# Patient Record
Sex: Male | Born: 1980 | Race: White | Hispanic: No | Marital: Married | Smoking: Never smoker
Health system: Southern US, Community
[De-identification: ages and names within clinical notes are randomized; demographics above are authoritative.]

---

## 2002-03-14 ENCOUNTER — Inpatient Hospital Stay (HOSPITAL_COMMUNITY): Admission: AC | Admit: 2002-03-14 | Discharge: 2002-03-16 | Payer: Self-pay

## 2002-03-18 ENCOUNTER — Emergency Department (HOSPITAL_COMMUNITY): Admission: EM | Admit: 2002-03-18 | Discharge: 2002-03-18 | Payer: Self-pay | Admitting: Emergency Medicine

## 2002-03-18 ENCOUNTER — Encounter: Payer: Self-pay | Admitting: Emergency Medicine

## 2002-03-19 ENCOUNTER — Encounter: Payer: Self-pay | Admitting: Emergency Medicine

## 2003-04-10 ENCOUNTER — Encounter: Payer: Self-pay | Admitting: Orthopedic Surgery

## 2003-04-10 ENCOUNTER — Ambulatory Visit (HOSPITAL_COMMUNITY): Admission: RE | Admit: 2003-04-10 | Discharge: 2003-04-10 | Payer: Self-pay | Admitting: Orthopedic Surgery

## 2003-07-24 ENCOUNTER — Emergency Department (HOSPITAL_COMMUNITY): Admission: AD | Admit: 2003-07-24 | Discharge: 2003-07-24 | Payer: Self-pay | Admitting: Family Medicine

## 2006-04-23 ENCOUNTER — Emergency Department (HOSPITAL_COMMUNITY): Admission: EM | Admit: 2006-04-23 | Discharge: 2006-04-23 | Payer: Self-pay | Admitting: Emergency Medicine

## 2009-12-12 ENCOUNTER — Encounter: Admission: RE | Admit: 2009-12-12 | Discharge: 2009-12-12 | Payer: Self-pay | Admitting: Occupational Medicine
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
## 2014-04-09 ENCOUNTER — Other Ambulatory Visit (HOSPITAL_BASED_OUTPATIENT_CLINIC_OR_DEPARTMENT_OTHER): Payer: Self-pay | Admitting: Family Medicine

## 2014-04-09 DIAGNOSIS — R319 Hematuria, unspecified: Secondary | ICD-10-CM

## 2014-04-12 ENCOUNTER — Ambulatory Visit (HOSPITAL_BASED_OUTPATIENT_CLINIC_OR_DEPARTMENT_OTHER): Payer: BC Managed Care – PPO

## 2015-03-18 ENCOUNTER — Emergency Department (INDEPENDENT_AMBULATORY_CARE_PROVIDER_SITE_OTHER): Payer: Self-pay

## 2015-03-18 ENCOUNTER — Emergency Department (INDEPENDENT_AMBULATORY_CARE_PROVIDER_SITE_OTHER)
Admission: EM | Admit: 2015-03-18 | Discharge: 2015-03-18 | Disposition: A | Discharge status: Home / Self Care | Payer: Self-pay | Source: Home / Self Care | Attending: Emergency Medicine | Admitting: Emergency Medicine

## 2015-03-18 ENCOUNTER — Encounter: Payer: Self-pay | Admitting: Emergency Medicine

## 2015-03-18 DIAGNOSIS — S93402A Sprain of unspecified ligament of left ankle, initial encounter: Secondary | ICD-10-CM

## 2015-03-18 DIAGNOSIS — M25472 Effusion, left ankle: Secondary | ICD-10-CM

## 2015-03-18 NOTE — ED Provider Notes (Addendum)
CSN: [id number]     Arrival date & time 03/18/15  0830 History   First MD Initiated Contact with Patient Friday  03/18/15 0836     Chief Complaint  Patient presents with  . Ankle Injury   [facility]  Patient is a 34 y.o. male presenting with ankle pain. The history is provided by the patient.  Ankle Pain Location:  Ankle Time since incident:  1 hour Injury: yes (Inversion injury while running while on the job. Works as Counselling psychologist)   Ankle location:  L ankle Pain details:    Quality:  Sharp   Radiates to:  Does not radiate   Severity:  Moderate   Onset quality:  Sudden   Progression:  Worsening Chronicity:  New (Although he had severe ankle sprain and same location 10 years ago which resolved a few weeks later) Relieved by:  Rest Worsened by:  Bearing weight Ineffective treatments:  Ice Associated symptoms: swelling   Associated symptoms: no back pain, no fever, no neck pain and no tingling   Risk factors: no known bone disorder and no recent illness     History reviewed. No pertinent past medical history. History reviewed. No pertinent past surgical history. No family history on file. History  Substance Use Topics  . Smoking status: Never Smoker   . Smokeless tobacco: Not on file  . Alcohol Use: Yes    Review of Systems  Constitutional: Negative for fever.  Musculoskeletal: Negative for back pain and neck pain.  All other systems reviewed and are negative.   Allergies  Review of patient's allergies indicates no known allergies.  Home Medications   Prior to Admission medications   Not on File   BP 122/83 mmHg  Pulse 72  Temp(Src) 98.1 F (36.7 C) (Oral)  Ht 5\' 11"  (1.803 m)  Wt 230 lb (104.327 kg)  BMI 32.09 kg/m2  SpO2 97% Physical Exam  Constitutional: He is oriented to person, place, and time. He appears well-developed and well-nourished. No distress.  HENT:  Head: Normocephalic and atraumatic.  Eyes: Conjunctivae and EOM  are normal. Pupils are equal, round, and reactive to light. No scleral icterus.  Neck: Normal range of motion.  Cardiovascular: Normal rate.   Pulmonary/Chest: Effort normal.  Abdominal: He exhibits no distension.  Musculoskeletal:       Left ankle: He exhibits decreased range of motion, swelling and ecchymosis. He exhibits no laceration and normal pulse. Tenderness. Lateral malleolus and AITFL tenderness found. No medial malleolus and no head of 5th metatarsal tenderness found. Achilles tendon normal.       Feet:  Neurovascular distally intact  Neurological: He is alert and oriented to person, place, and time.  Skin: Skin is warm.  Psychiatric: He has a normal mood and affect.  Nursing note and vitals reviewed.   ED Course  Procedures (including critical care time) Labs Review Labs Reviewed - No data to display  Imaging Review Dg Ankle Complete Left  03/18/2015   CLINICAL DATA:  Twisting injury left ankle while running this morning. Initial encounter.  EXAM: LEFT ANKLE COMPLETE - 3+ VIEW  COMPARISON:  Plain films left ankle 04/23/2006.  FINDINGS: Lateral soft tissue swelling is identified. There is no fracture or dislocation. No focal bony lesion is identified. Small plantar calcaneal spur is noted.  IMPRESSION: Lateral soft tissue swelling without underlying acute bony or joint abnormality.   Electronically Signed   By: Drusilla Kanner M.D.   On: 03/18/2015 09:10  MDM   1. Severe ankle sprain, left, initial encounter   Treatment options discussed, as well as risks, benefits, alternatives. Patient voiced understanding and agreement with the following plans: Encourage rest, ice, compression with ACE bandage, and elevation of injured body part. I applied Ace bandage Cam boot applied. Advised crutches, he declined crutches here today. Ibuprofen OTC 800 mg 3 times a day. He declined prescription for ibuprofen Advised and offered small prescription for Vicodin for pain relief for  nighttime use, he declined that option.   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, [address]  [place], [place] [address]   If he is not significantly improving at the follow-up appointment at employer health services on 03/24/2015, I would anticipate referral to orthopedist at that point.   Lajean Manes, MD 03/18/15 1030

## 2015-03-18 NOTE — ED Notes (Signed)
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

## 2015-12-05 NOTE — Progress Notes (Signed)
This encounter was created in error - please disregard.

## 2016-10-13 ENCOUNTER — Emergency Department (INDEPENDENT_AMBULATORY_CARE_PROVIDER_SITE_OTHER): Payer: Worker's Compensation

## 2016-10-13 ENCOUNTER — Emergency Department (INDEPENDENT_AMBULATORY_CARE_PROVIDER_SITE_OTHER)
Admission: EM | Admit: 2016-10-13 | Discharge: 2016-10-13 | Disposition: A | Discharge status: Home / Self Care | Payer: Worker's Compensation | Source: Home / Self Care | Attending: Family Medicine | Admitting: Family Medicine

## 2016-10-13 ENCOUNTER — Encounter: Payer: Self-pay | Admitting: Emergency Medicine

## 2016-10-13 DIAGNOSIS — M79672 Pain in left foot: Secondary | ICD-10-CM | POA: Diagnosis not present

## 2016-10-13 DIAGNOSIS — S99912A Unspecified injury of left ankle, initial encounter: Secondary | ICD-10-CM

## 2016-10-13 DIAGNOSIS — W010XXA Fall on same level from slipping, tripping and stumbling without subsequent striking against object, initial encounter: Secondary | ICD-10-CM

## 2016-10-13 DIAGNOSIS — S8262XA Displaced fracture of lateral malleolus of left fibula, initial encounter for closed fracture: Secondary | ICD-10-CM

## 2016-10-13 DIAGNOSIS — S93402A Sprain of unspecified ligament of left ankle, initial encounter: Secondary | ICD-10-CM | POA: Diagnosis not present

## 2016-10-13 DIAGNOSIS — Y990 Civilian activity done for income or pay: Secondary | ICD-10-CM

## 2016-10-13 NOTE — ED Triage Notes (Signed)
Pt states he rolled his left ankle after stepping in a hole at work today. States he has several prev injuries to that foot. No meds for this
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
## 2016-10-13 NOTE — ED Triage Notes (Signed)
Using ice. Offered meds but pt denied.

## 2017-12-06 IMAGING — DX DG FOOT COMPLETE 3+V*L*
3 series · 3 of 3 positions shown · non-contrast
Comparison: None.

CLINICAL DATA: Lateral left foot and ankle pain since stepping in a
hole 1.5 hours ago. Initial encounter.

EXAM:
LEFT FOOT - COMPLETE 3+ VIEW

[foot ap]
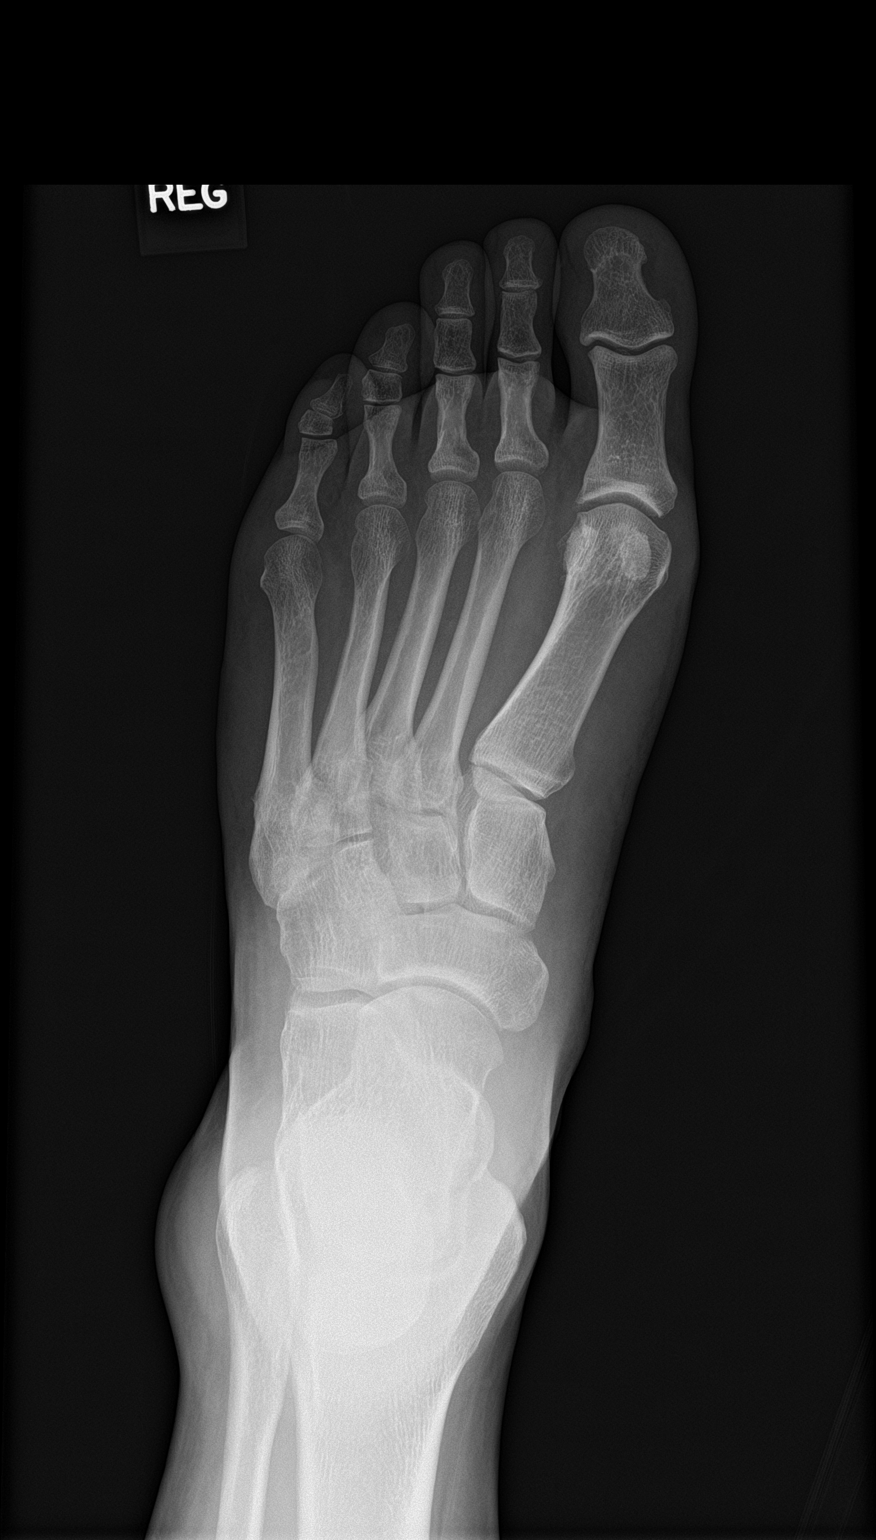

[foot obl]
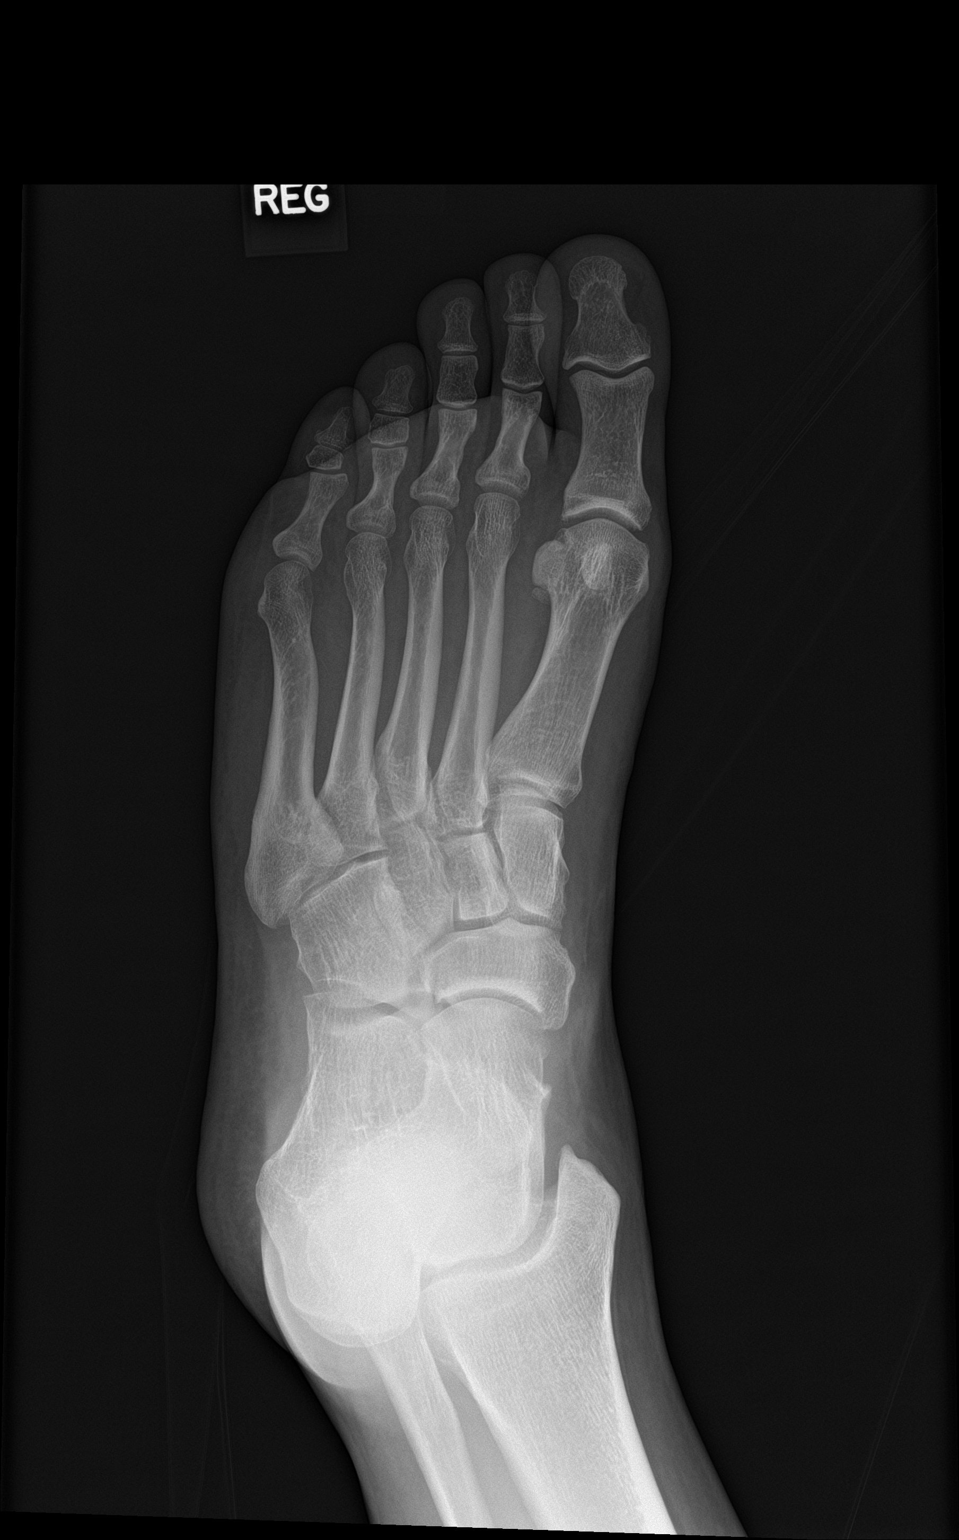

[foot lat]
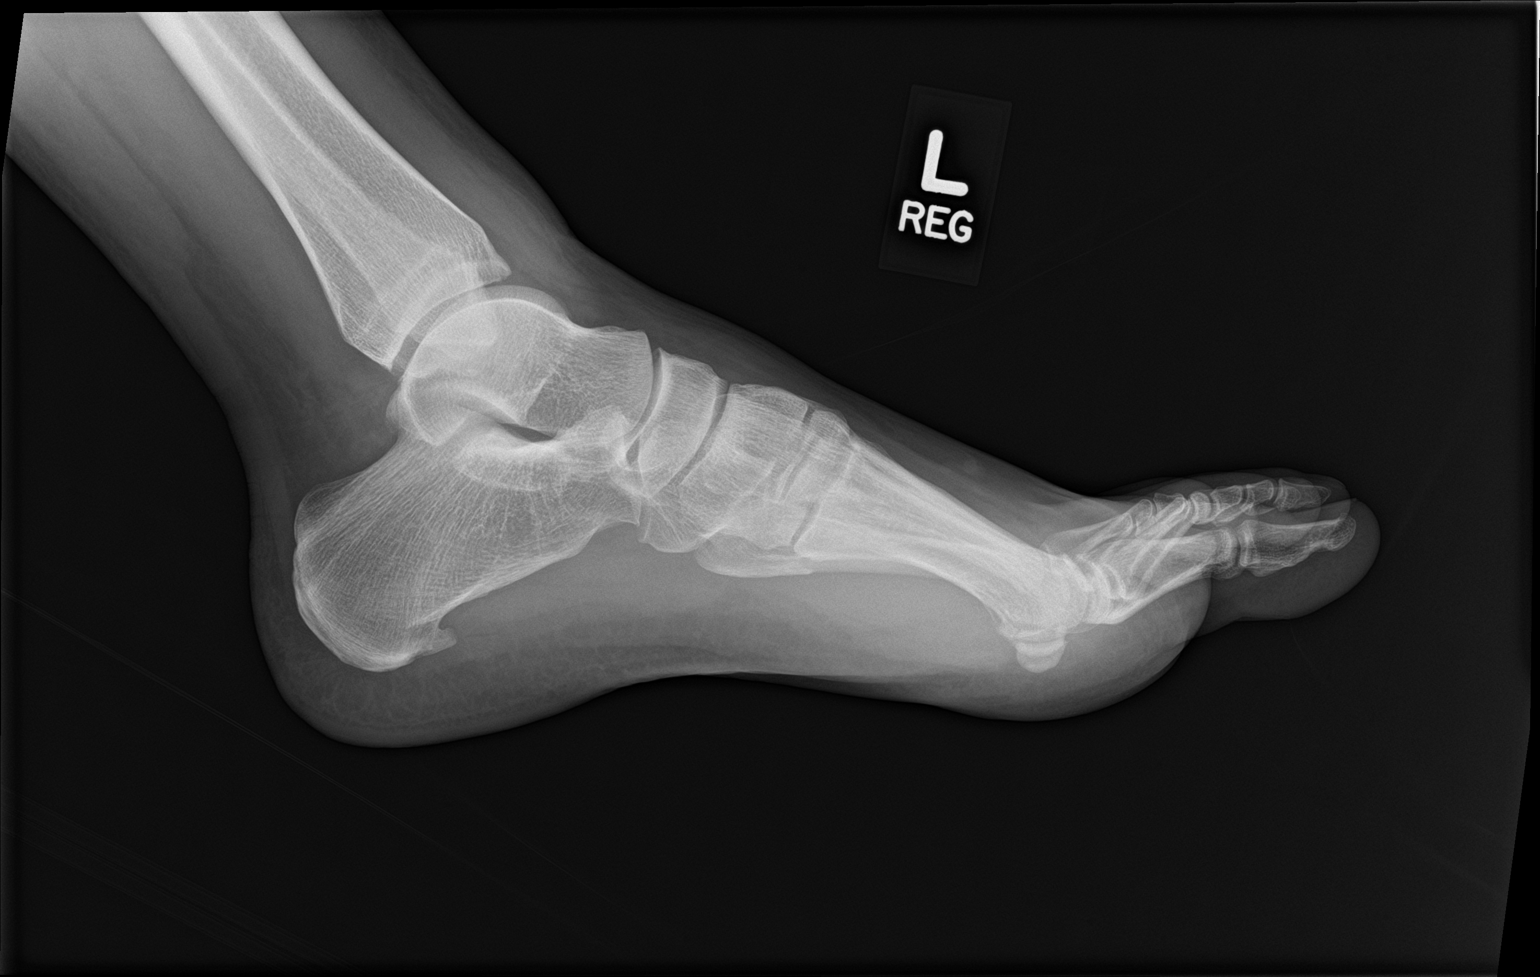

[3 of 3 positions shown; findings below may reference images not displayed]

FINDINGS: There is no evidence of fracture or dislocation. There is no
evidence of arthropathy or other focal bone abnormality. Small
plantar calcaneal spur is noted. Soft tissues are unremarkable.
IMPRESSION: No acute abnormality.

## 2017-12-06 IMAGING — DX DG ANKLE COMPLETE 3+V*L*
3 series · 3 of 3 positions shown · non-contrast
Comparison: Plain films left ankle 03/18/2015.

CLINICAL DATA: Left ankle injury the patient stepped in a hole
today. Pain. Initial encounter.

EXAM:
LEFT ANKLE COMPLETE - 3+ VIEW

[ankle ap]
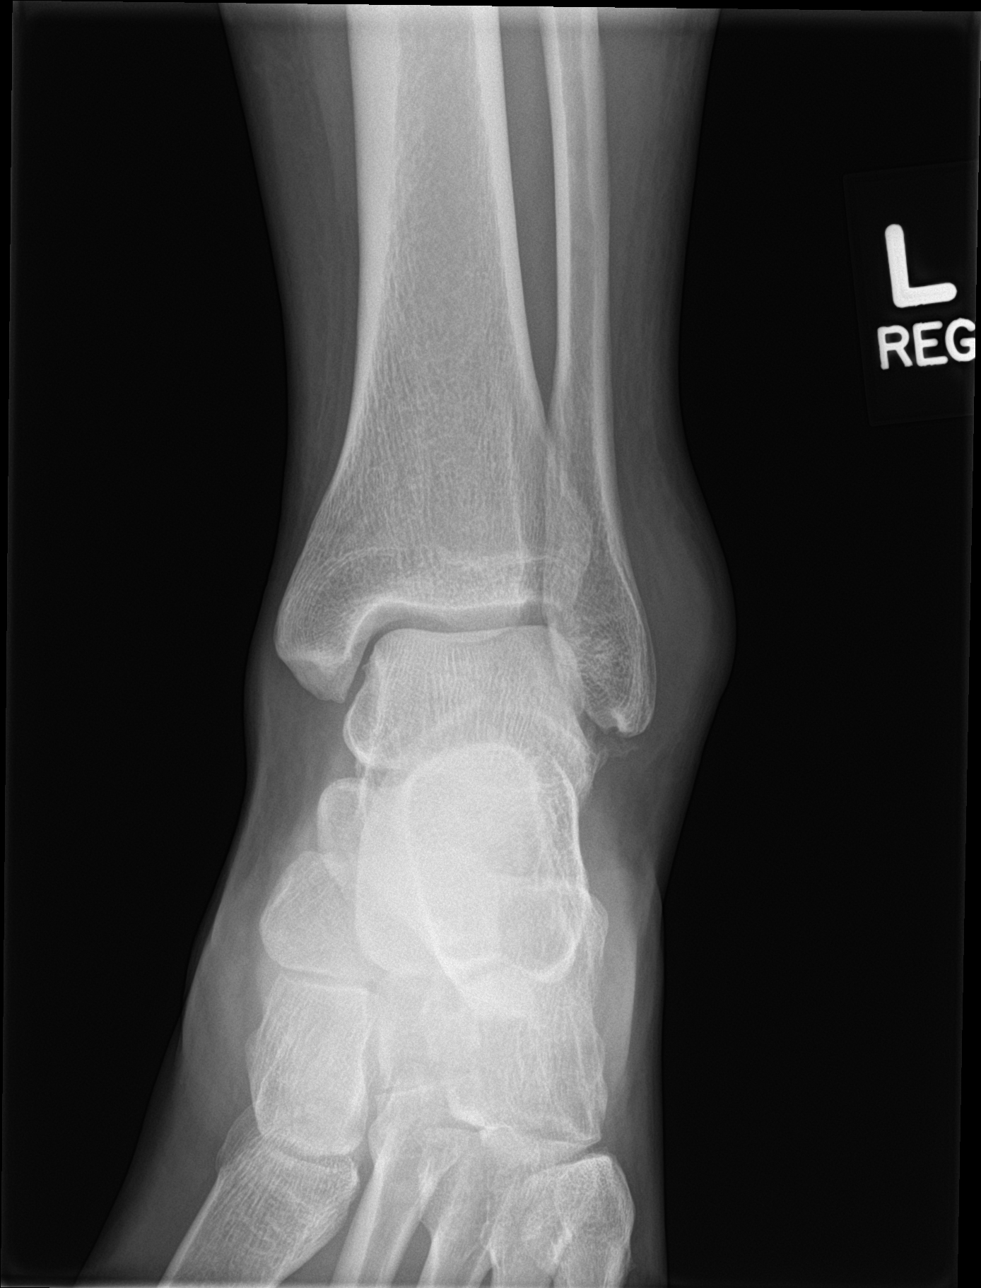

[ankle obl]
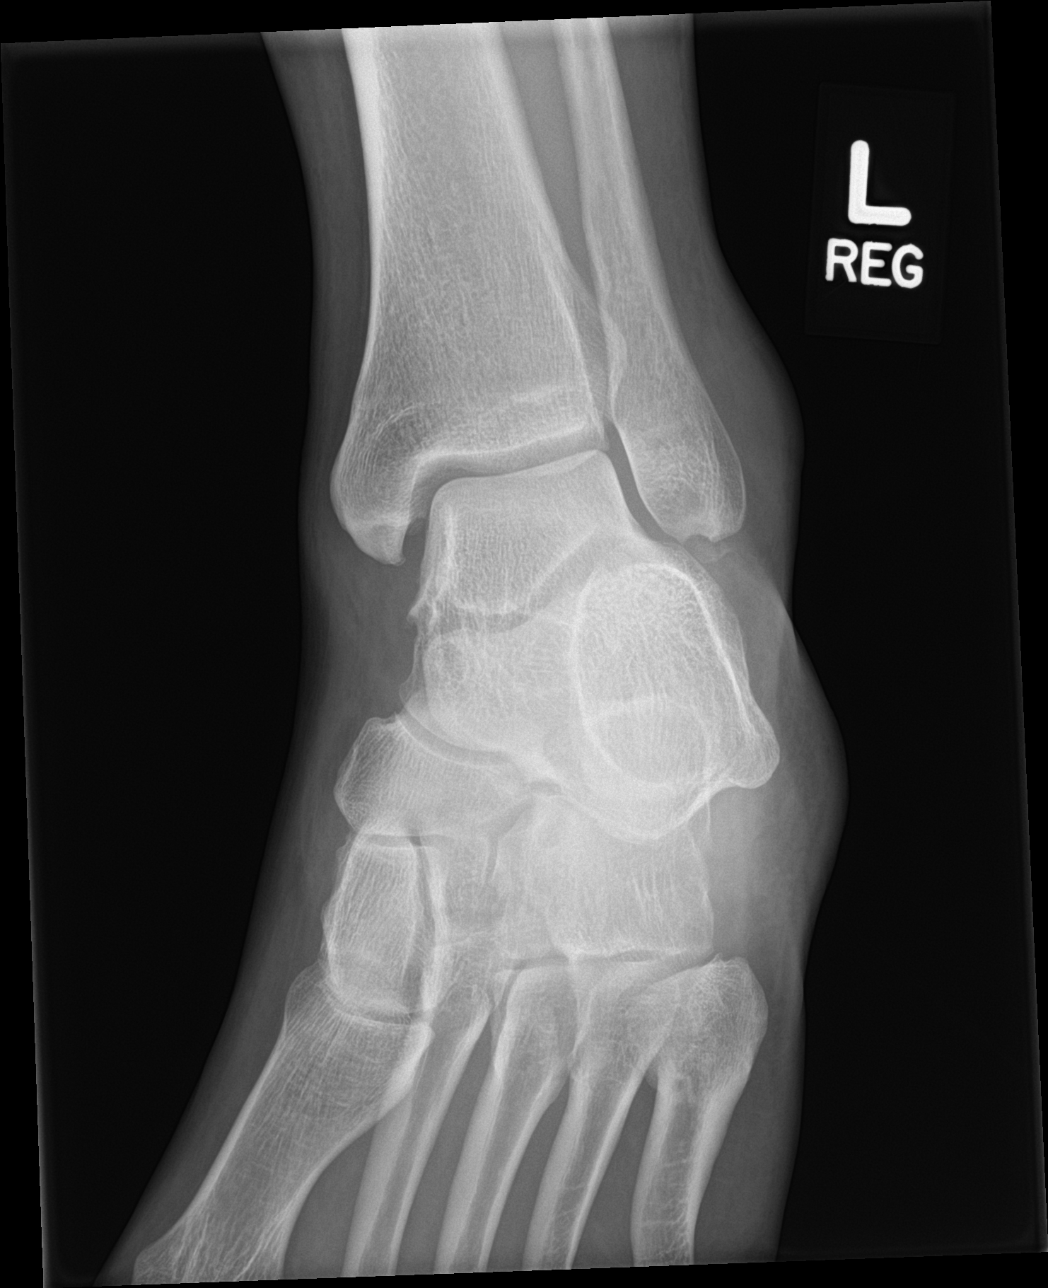

[ankle lat]
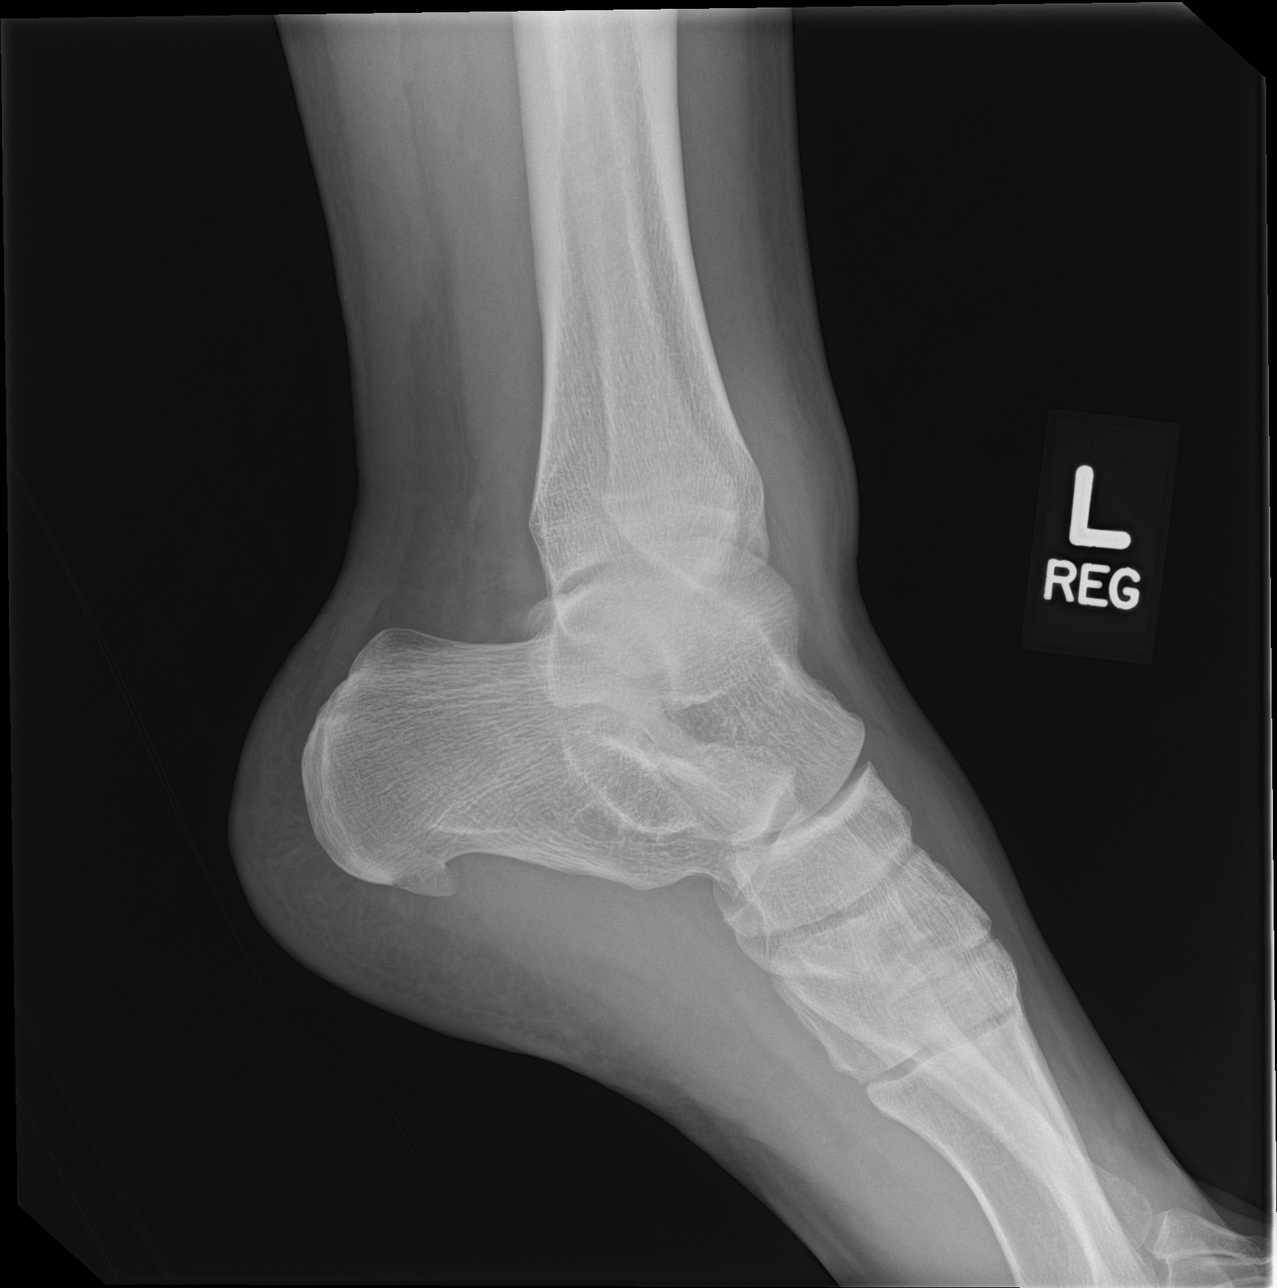

[3 of 3 positions shown; findings below may reference images not displayed]

FINDINGS: There is soft tissue swelling about the lateral malleolus. Small
bony fragment is seen off the tip of the lateral malleolus
compatible with an age-indeterminate avulsion fracture. No other
fracture is identified. No tibiotalar joint effusion. Plantar
calcaneal spur noted.
IMPRESSION: Lateral soft tissue swelling with a small age-indeterminate avulsion
fracture off the lateral malleolus. The fracture is new since [DATE].
